# Patient Record
Sex: Male | Born: 2000 | Race: White | Hispanic: Yes | Marital: Single | Smoking: Never smoker
Health system: Southern US, Community
[De-identification: ages and names within clinical notes are randomized; demographics above are authoritative.]

## PROBLEM LIST (undated history)

## (undated) DIAGNOSIS — Z8669 Personal history of other diseases of the nervous system and sense organs: Secondary | ICD-10-CM

## (undated) HISTORY — PX: NO PAST SURGERIES: SHX2092

## (undated) HISTORY — DX: Personal history of other diseases of the nervous system and sense organs: Z86.69

---
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
## 2019-02-02 ENCOUNTER — Encounter (HOSPITAL_COMMUNITY): Payer: Self-pay

## 2019-02-02 ENCOUNTER — Emergency Department (HOSPITAL_COMMUNITY): Payer: Medicaid Other

## 2019-02-02 ENCOUNTER — Emergency Department (HOSPITAL_COMMUNITY)
Admission: EM | Admit: 2019-02-02 | Discharge: 2019-02-03 | Disposition: A | Discharge status: Home / Self Care | Payer: Medicaid Other | Attending: Emergency Medicine | Admitting: Emergency Medicine

## 2019-02-02 DIAGNOSIS — Y9389 Activity, other specified: Secondary | ICD-10-CM | POA: Diagnosis not present

## 2019-02-02 DIAGNOSIS — S7001XA Contusion of right hip, initial encounter: Secondary | ICD-10-CM | POA: Insufficient documentation

## 2019-02-02 DIAGNOSIS — Y929 Unspecified place or not applicable: Secondary | ICD-10-CM | POA: Insufficient documentation

## 2019-02-02 DIAGNOSIS — S299XXA Unspecified injury of thorax, initial encounter: Secondary | ICD-10-CM | POA: Diagnosis present

## 2019-02-02 DIAGNOSIS — S2231XA Fracture of one rib, right side, initial encounter for closed fracture: Secondary | ICD-10-CM | POA: Insufficient documentation

## 2019-02-02 DIAGNOSIS — S62002A Unspecified fracture of navicular [scaphoid] bone of left wrist, initial encounter for closed fracture: Secondary | ICD-10-CM | POA: Insufficient documentation

## 2019-02-02 DIAGNOSIS — M791 Myalgia, unspecified site: Secondary | ICD-10-CM

## 2019-02-02 DIAGNOSIS — Y998 Other external cause status: Secondary | ICD-10-CM | POA: Diagnosis not present

## 2019-02-02 MED ORDER — KETOROLAC TROMETHAMINE 60 MG/2ML IM SOLN
60.0000 mg | Freq: Once | INTRAMUSCULAR | Status: AC
  Administered 2019-02-03: 60 mg via INTRAMUSCULAR
  Filled 2019-02-02: qty 2

## 2019-02-02 MED ORDER — IBUPROFEN 800 MG PO TABS
800.0000 mg | ORAL_TABLET | Freq: Once | ORAL | Status: AC
  Administered 2019-02-03: 01:00:00 800 mg via ORAL
  Filled 2019-02-02: qty 1

## 2019-02-02 MED ORDER — OXYCODONE-ACETAMINOPHEN 5-325 MG PO TABS
1.0000 | ORAL_TABLET | Freq: Once | ORAL | Status: AC
  Administered 2019-02-03: 1 via ORAL
  Filled 2019-02-02: qty 1

## 2019-02-02 MED ORDER — OXYCODONE-ACETAMINOPHEN 5-325 MG PO TABS
1.0000 | ORAL_TABLET | ORAL | Status: DC | PRN
  Administered 2019-02-02: 1 via ORAL
  Filled 2019-02-02: qty 1

## 2019-02-02 NOTE — ED Triage Notes (Signed)
Pt states that he was riding a horse and the saddle came off, pt fell off, did not hit head, no LOC, feeling some dizziness, some SOB, c/o of pain to L wrist, abrasion to R shoulder and R flank and R hip pain

## 2019-02-02 NOTE — ED Notes (Signed)
Please call mother Denton Brick or Girl York Pellant at [phone] or [phone]

## 2019-02-02 NOTE — ED Notes (Signed)
Pt requesting pain medication.  

## 2019-02-02 NOTE — ED Provider Notes (Signed)
[facility] Provider Note   CSN: [id number] Arrival date & time: 02/02/19  2030    History   Chief Complaint Chief Complaint  Patient presents with  . Fall    HPI Zachary Lambert is a 18 y.o. male.     18 year old right hand dominant male presents to the emergency department for evaluation after falling off of a horse.  He states that he was riding a horse when the saddle came loose and he fell off on his right side.  He was not wearing a helmet, but did not hit his head and did not lose consciousness.  Initially felt dizziness with some shortness of breath, worse with deep breathing.  Has persistently been having pain to his left wrist, right hip.  Pain to wrist and hip are worse with movement and ambulation, respectively.  He had no improvement in pain with Percocet given in triage.  No abdominal pain, bowel or bladder incontinence, extremity numbness or paresthesias, extremity weakness, nausea or vomiting.  The history is provided by the patient. No language interpreter was used.  Fall    Past Medical History:  Diagnosis Date  . History of migraine headaches    D/Cd Sumatriptan in 2014 for lack of need    Patient Active Problem List   Diagnosis Date Noted  . 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 Ankle pain, left 03/11/2012  . 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 NO PAST SURGERIES          Home Medications    Prior to Admission medications   Medication Sig Start Date End Date Taking? Authorizing Provider  fluticasone (FLONASE) 50 MCG/ACT nasal spray Sniff one spray into each nostril once a day for allergy symptom control.  Gargle and spit after use. 12/04/16   Lurlean Leyden, MD  HYDROcodone-acetaminophen (NORCO/VICODIN) 5-325 MG tablet Take 1 tablet by mouth every 6 (six) hours as  needed for severe pain. 02/03/19   Antonietta Breach, PA-C  ibuprofen (ADVIL) 800 MG tablet Take one tablet every 8 to 12 hours as needed for pain management 02/03/19   Antonietta Breach, PA-C  loratadine (CLARITIN) 10 MG tablet Take one tablet by mouth once daily for allergy symptom control 10/26/16   Lurlean Leyden, MD    Family History Family History  Problem Relation Age of Onset  . Diabetes Mother   . Hypertension Mother   . Obesity Mother   . Migraines Mother   . Hyperlipidemia Father   . Hypertension Maternal Grandmother   . Diabetes Maternal Grandfather   . Cancer Paternal Grandfather        Colon CA -died age 29yrs  . Asthma Brother   . Cancer Paternal Uncle   . Seizures Neg Hx   . Depression Neg Hx   . Anxiety disorder Neg Hx   . Bipolar disorder Neg Hx   . Schizophrenia Neg Hx   . ADD / ADHD Neg Hx   . Autism Neg Hx     Social History Social History   Tobacco Use  . Smoking status: Never Smoker  . Smokeless tobacco: Never Used  Substance Use Topics  . Alcohol use: Not on file  . Drug use: Not on file     Allergies   Patient has no known allergies.   Review of Systems Review of Systems Ten systems  reviewed and are negative for acute change, except as noted in the HPI.    Physical Exam Updated Vital Signs BP 139/74   Pulse 76   Temp 98.5 F (36.9 C) (Oral)   Resp 19   SpO2 100%   Physical Exam Vitals signs and nursing note reviewed.  Constitutional:      General: He is not in acute distress.    Appearance: He is well-developed. He is not diaphoretic.     Comments: Nontoxic appearing and in NAD  HENT:     Head: Normocephalic and atraumatic.     Comments: No battle's sign or raccoon's eyes.    Right Ear: External ear normal.     Left Ear: External ear normal.  Eyes:     General: No scleral icterus.    Conjunctiva/sclera: Conjunctivae normal.  Neck:     Musculoskeletal: Normal range of motion.     Comments: Normal ROM Cardiovascular:     Rate and  Rhythm: Normal rate and regular rhythm.     Pulses: Normal pulses.  Pulmonary:     Effort: Pulmonary effort is normal. No respiratory distress.     Breath sounds: No stridor. No wheezing, rhonchi or rales.     Comments: Respirations even and unlabored. TTP to right posterior chest wall without crepitus or deformity. Lungs CTAB. Abdominal:     Palpations: Abdomen is soft.     Tenderness: There is no abdominal tenderness.     Comments: Soft, nontender, nondistended.  Musculoskeletal: Normal range of motion.     Comments: Normal AROM and PROM of the right hip. No leg shortening or malrotation. TTP to the right hip joint. No crepitus or deformity.  TTP to the radial aspect of the left wrist at the base of the thumb. +Snuffbox tenderness. No crepitus, deformity, or significant swelling.  Skin:    General: Skin is warm and dry.     Coloration: Skin is not pale.     Findings: Abrasion present. No erythema or rash.       Neurological:     General: No focal deficit present.     Mental Status: He is alert and oriented to person, place, and time.     Coordination: Coordination normal.  Psychiatric:        Behavior: Behavior normal.      ED Treatments / Results  Labs (all labs ordered are listed, but only abnormal results are displayed) Labs Reviewed - No data to display  EKG None  Radiology Dg Ribs Unilateral W/chest Right  Result Date: 02/03/2019 CLINICAL DATA:  Fall off horse with right chest pain. Pleuritic pain. EXAM: RIGHT RIBS AND CHEST - 3+ VIEW COMPARISON:  None. FINDINGS: Suspected nondisplaced fracture of the anterolateral eleventh rib. Remaining ribs are intact. No pneumothorax, focal airspace disease, or pleural fluid. Normal heart size and mediastinal contours. IMPRESSION: Suspected nondisplaced right anterolateral eleventh rib fracture. No pulmonary complication such as pneumothorax. Electronically Signed   By: Narda RutherfordMelanie  Sanford M.D.   On: 02/03/2019 00:25   Dg Wrist  Complete Left  Result Date: 02/02/2019 CLINICAL DATA:  Pt. Larey SeatFell off of a horse today and landed on left wrist extended. Pt. Also c/o right hip pain. Painful with weight bearing. EXAM: LEFT WRIST - COMPLETE 3+ VIEW COMPARISON:  None. FINDINGS: There is a subtle nondisplaced fracture along the volar radial margin of the distal scaphoid. No other evidence of a fracture. The wrist joints are normally spaced and aligned. Soft tissues are unremarkable. IMPRESSION: 1.  Subtle, nondisplaced fracture along the radial distal margin of the scaphoid. 2. No other fracture.  No dislocation. Electronically Signed   By: Amie Portlandavid  Ormond M.D.   On: 02/02/2019 21:26   Ct Hip Right Wo Contrast  Result Date: 02/03/2019 CLINICAL DATA:  Right hip pain after fall off horse. EXAM: CT OF THE RIGHT HIP WITHOUT CONTRAST TECHNIQUE: Multidetector CT imaging of the right hip was performed according to the standard protocol. Multiplanar CT image reconstructions were also generated. COMPARISON:  Radiograph yesterday FINDINGS: Bones/Joint/Cartilage No acute fracture. The femoral head is located. Hip joint spaces preserved. Incidental bone island in the right acetabulum. Pubic symphyseal growth plates have not yet fused, normal for age. Ligaments Suboptimally assessed by CT. Muscles and Tendons No confluent muscle hematoma. Soft tissues Mild posterior subcutaneous edema about the upper gluteal region. IMPRESSION: 1. No fracture or acute osseous abnormality of the right hip. 2. Subcutaneous soft tissue edema posteriorly. Electronically Signed   By: Narda RutherfordMelanie  Sanford M.D.   On: 02/03/2019 00:11   Dg Hip Unilat  With Pelvis 2-3 Views Right  Result Date: 02/02/2019 CLINICAL DATA:  Larey SeatFell off a horse today, RIGHT hip pain EXAM: DG HIP (WITH OR WITHOUT PELVIS) 2-3V RIGHT COMPARISON:  None FINDINGS: Osseous mineralization normal. Joint spaces preserved. No fracture, dislocation, or bone destruction. IMPRESSION: Normal exam. Electronically Signed   By:  Ulyses SouthwardMark  Boles M.D.   On: 02/02/2019 21:25    Procedures Procedures (including critical care time)  Medications Ordered in ED Medications  ibuprofen (ADVIL) tablet 800 mg (800 mg Oral Given 02/03/19 0033)  oxyCODONE-acetaminophen (PERCOCET/ROXICET) 5-325 MG per tablet 1 tablet (1 tablet Oral Given 02/03/19 0033)  ketorolac (TORADOL) injection 60 mg (60 mg Intramuscular Given 02/03/19 0033)     Initial Impression / Assessment and Plan / ED Course  I have reviewed the triage vital signs and the nursing notes.  Pertinent labs & imaging results that were available during my care of the patient were reviewed by me and considered in my medical decision making (see chart for details).        18 year old male presenting following a fall from a horse.  He had no head trauma or loss of consciousness.  Noting left wrist pain and right hip pain primarily.  Also complaining of some right-sided pleuritic pain.  He underwent imaging of the wrist, hip, ribs.  Work-up reveals left scaphoid fracture and right 11th rib fracture.  No pneumothorax or evidence of hip fracture.  Neurovascularly intact.  No red flags or signs concerning for cauda equina. VSS.  Wrist stabilized with thumb spica splint.  Pain was treated with Toradol and Percocet in the ED.  He has been instructed to follow-up with orthopedic hand surgery to ensure proper healing of his scaphoid fracture.  Advised continued use of NSAIDs with PRN use of Norco for pain control.  Return precautions discussed and provided. Patient discharged in stable condition with no unaddressed concerns.   Final Clinical Impressions(s) / ED Diagnoses   Final diagnoses:  Fall from horse, initial encounter  Closed fracture of one rib of right side, initial encounter  Closed nondisplaced fracture of scaphoid of left wrist, unspecified portion of scaphoid, initial encounter  Contusion of right hip, initial encounter    ED Discharge Orders         Ordered     ibuprofen (ADVIL) 800 MG tablet     02/03/19 0134    HYDROcodone-acetaminophen (NORCO/VICODIN) 5-325 MG tablet  Every 6 hours PRN  02/03/19 0134           Antony MaduraHumes, [place] Pechacek, PA-C 02/03/19 [id number]    Geoffery Lyonselo, [place], MD 02/03/19 [id number]

## 2019-02-03 ENCOUNTER — Emergency Department (HOSPITAL_COMMUNITY): Payer: Medicaid Other

## 2019-02-03 MED ORDER — IBUPROFEN 800 MG PO TABS: ORAL_TABLET | ORAL | 0 refills | Status: AC

## 2019-02-03 MED ORDER — HYDROCODONE-ACETAMINOPHEN 5-325 MG PO TABS: 1.0000 | ORAL_TABLET | Freq: Four times a day (QID) | ORAL | 0 refills | Status: AC | PRN

## 2019-02-03 NOTE — Discharge Instructions (Signed)
Keep your wrist splint on at all times.  Do not remove and was told to do so by an orthopedic hand surgeon.  Call the office of Dr. Fredna Dow in the morning to schedule an office visit for follow-up in the next 1 to 2 weeks.  Ice areas of pain to limit inflammation.  Take ibuprofen as prescribed for pain control.  If pain is severe, use Norco as prescribed.  Do not drive or drink alcohol after taking this medication as it may make you drowsy and impair your judgment.  You may see your primary care doctor for repeat evaluation in 1 week.  Return to the ED for new or concerning symptoms.

## 2019-02-03 NOTE — ED Notes (Signed)
Ortho tech placed spica splint on L wrist. ED staff provided crutches with education.

## 2020-05-09 IMAGING — DX RIGHT RIBS AND CHEST - 3+ VIEW
4 series · 4 of 4 positions shown · non-contrast
Comparison: None.

CLINICAL DATA: Fall off horse with right chest pain. Pleuritic
pain.

EXAM:
RIGHT RIBS AND CHEST - 3+ VIEW

[chest pa]
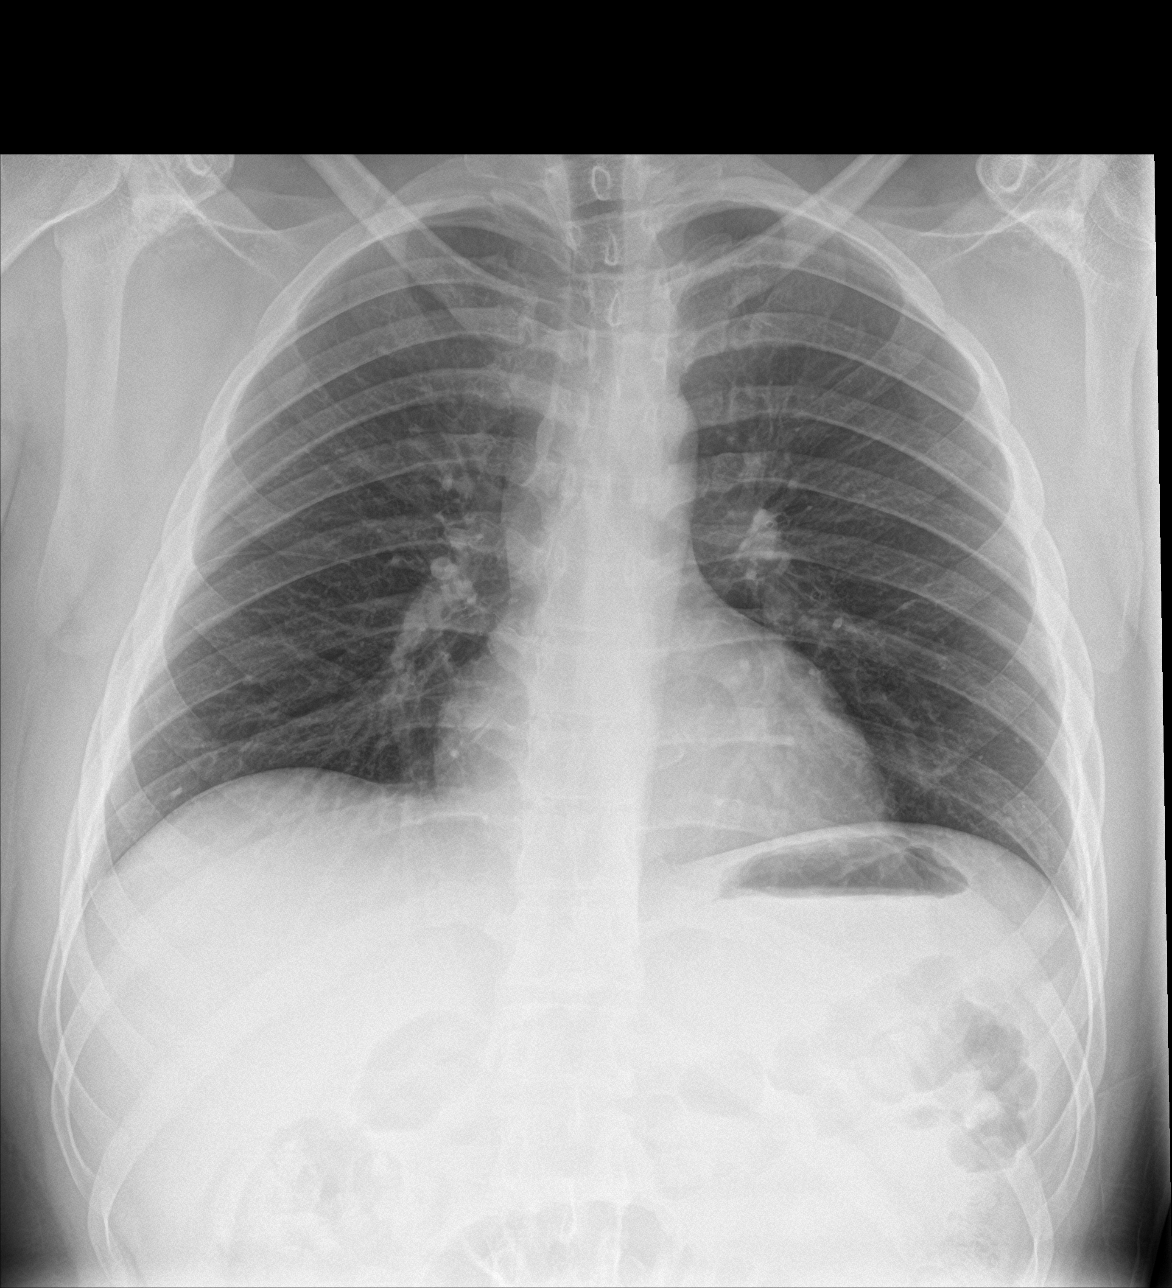

[rib pa]
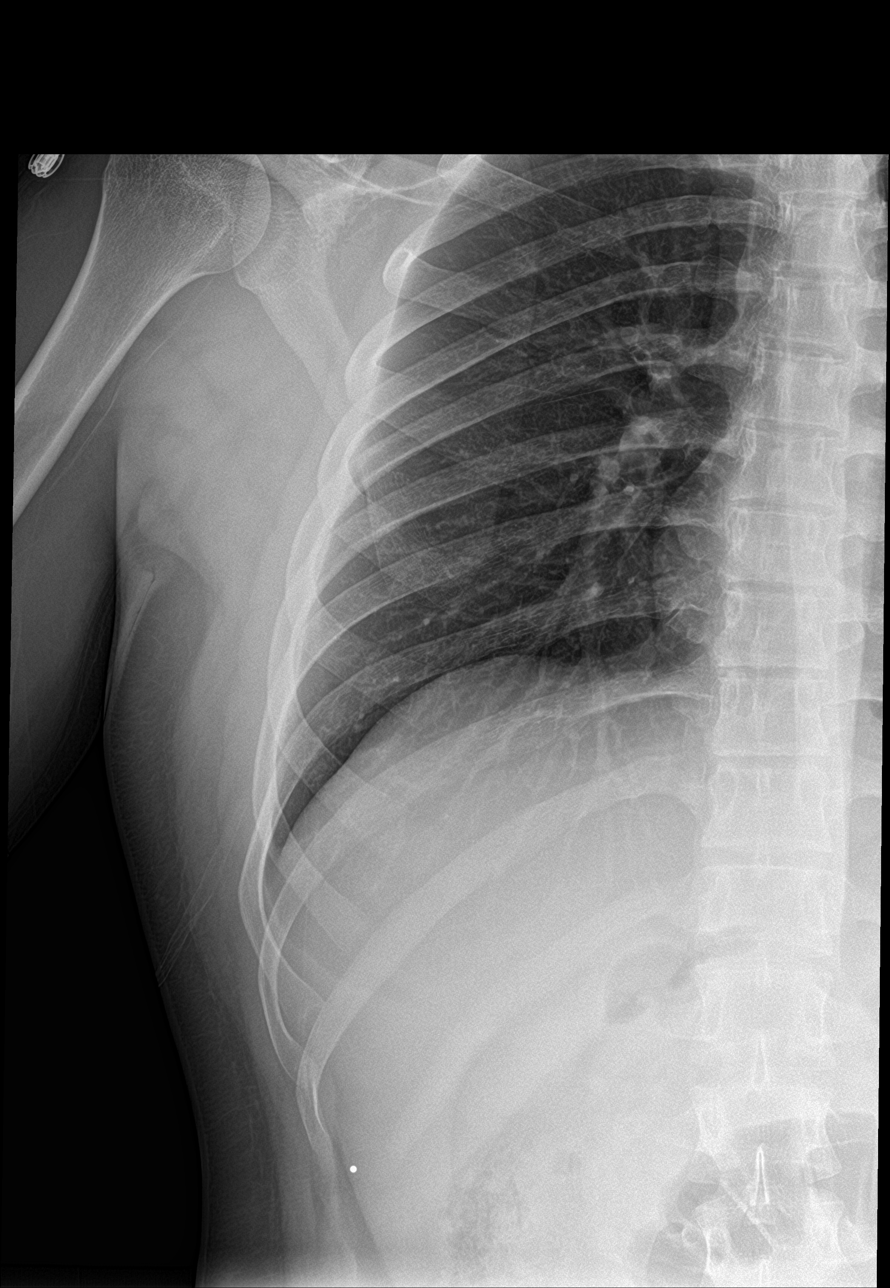

[rib pa obl]
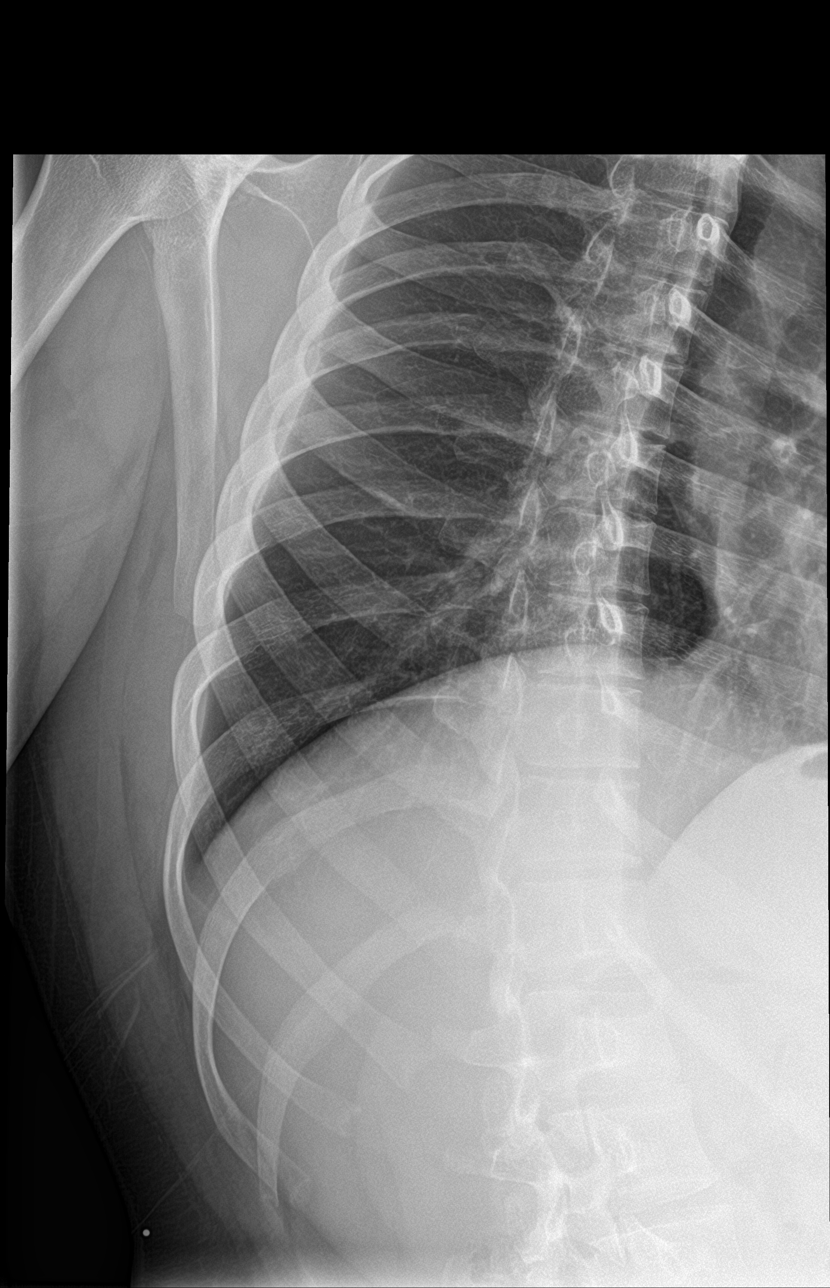

[rib ap obl]
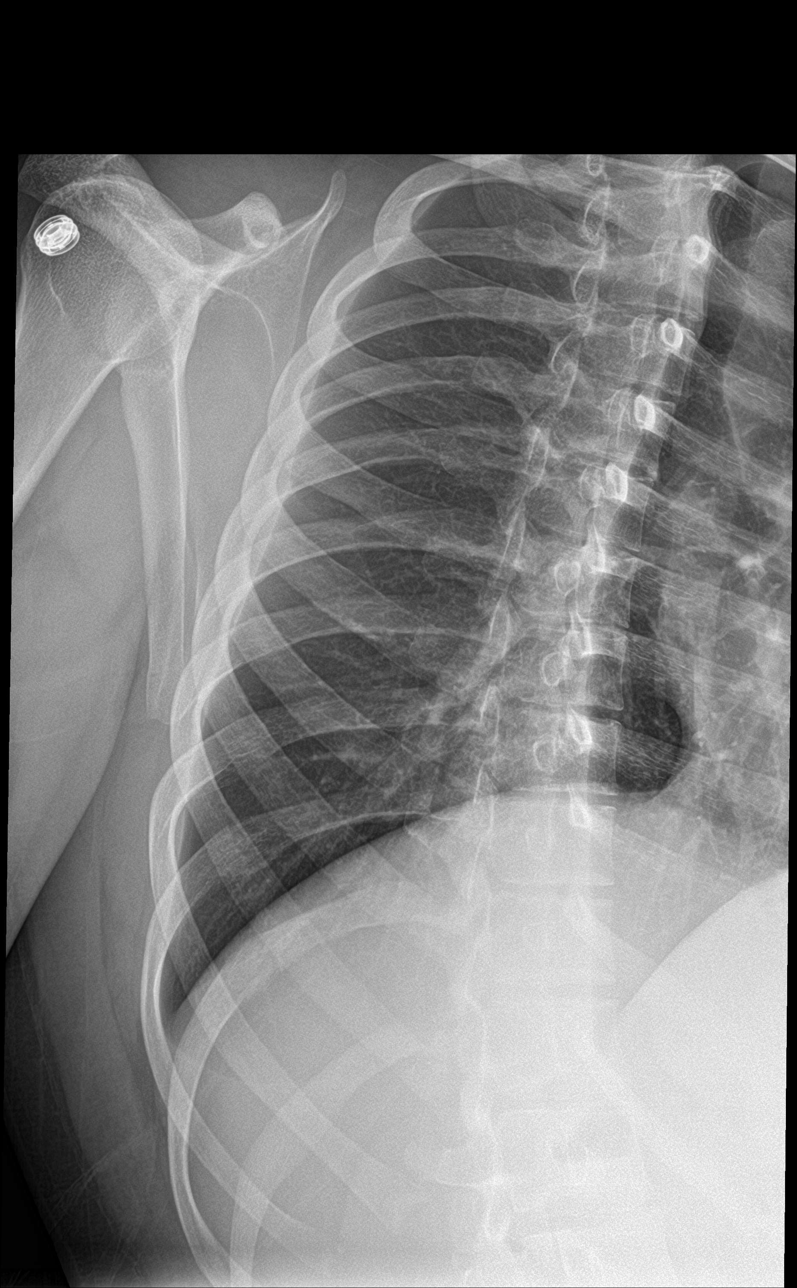

[4 of 4 positions shown; findings below may reference images not displayed]

FINDINGS: Suspected nondisplaced fracture of the anterolateral eleventh rib.
Remaining ribs are intact. No pneumothorax, focal airspace disease,
or pleural fluid. Normal heart size and mediastinal contours.
IMPRESSION: Suspected nondisplaced right anterolateral eleventh rib fracture. No
pulmonary complication such as pneumothorax.

## 2020-05-09 IMAGING — CT CT OF THE RIGHT HIP WITHOUT CONTRAST
2 of 3 series · 16 of 46 positions shown, 18 images · non-contrast
Comparison: Radiograph yesterday

CLINICAL DATA: Right hip pain after fall off horse.

EXAM:
CT OF THE RIGHT HIP WITHOUT CONTRAST
TECHNIQUE: Multidetector CT imaging of the right hip was performed according to
the standard protocol. Multiplanar CT image reconstructions were
also generated.

[Series 5: hip 2.0 st · axial · 0.55mm/px · z∈[+570,+840]mm · 13 of 155 slices shown, 15 images]
[im 10/155  soft-tissue]
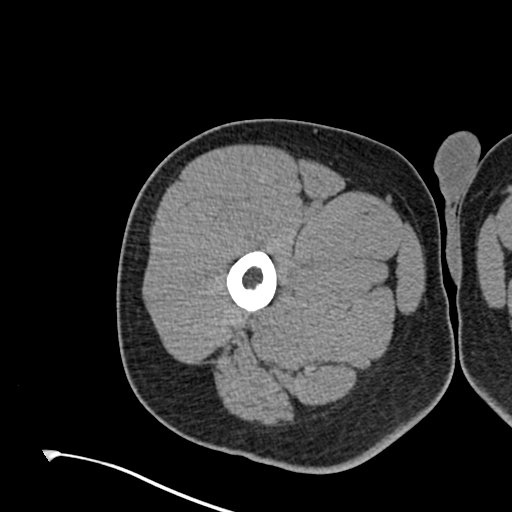
[im 10/155  bone]
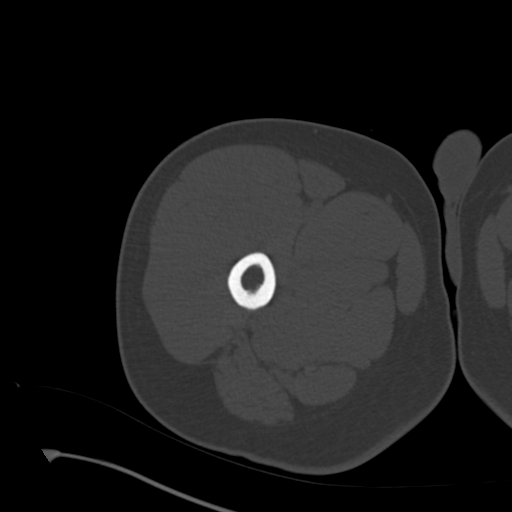
[im 20/155  soft-tissue]
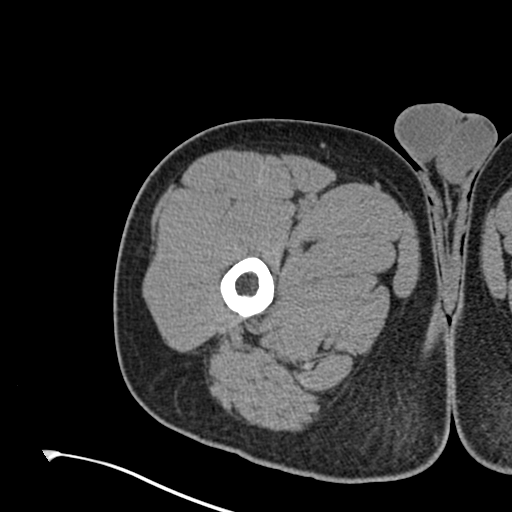
[im 30/155  soft-tissue]
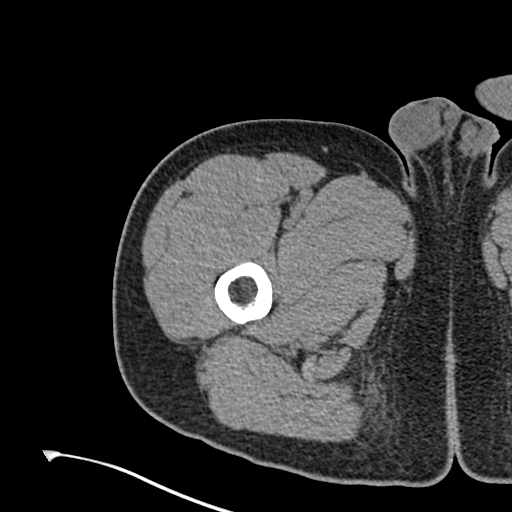
[im 45/155  soft-tissue]
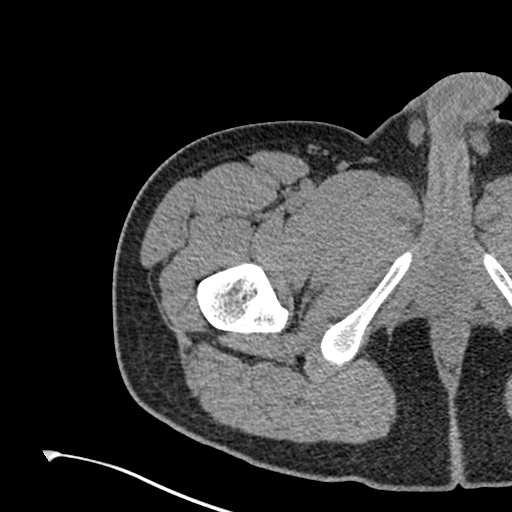
[im 55/155  soft-tissue]
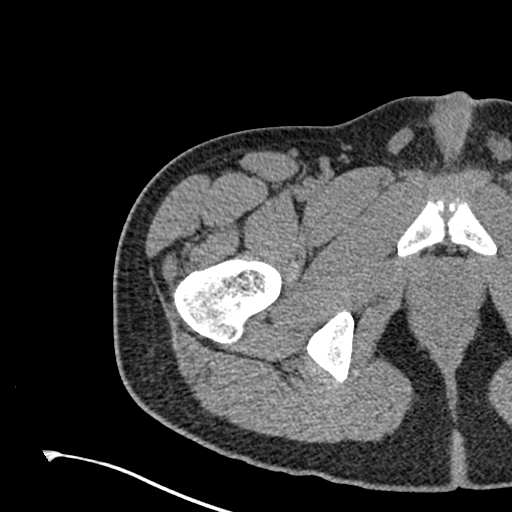
[im 65/155  soft-tissue]
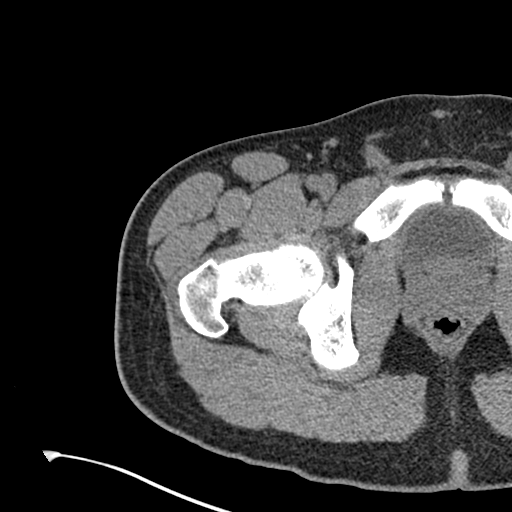
[im 80/155  soft-tissue]
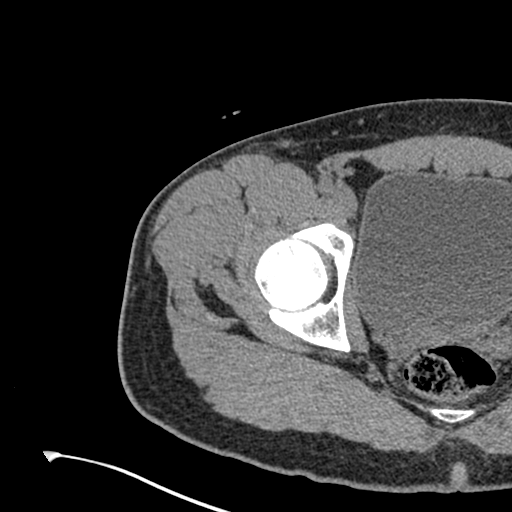
[im 90/155  soft-tissue]
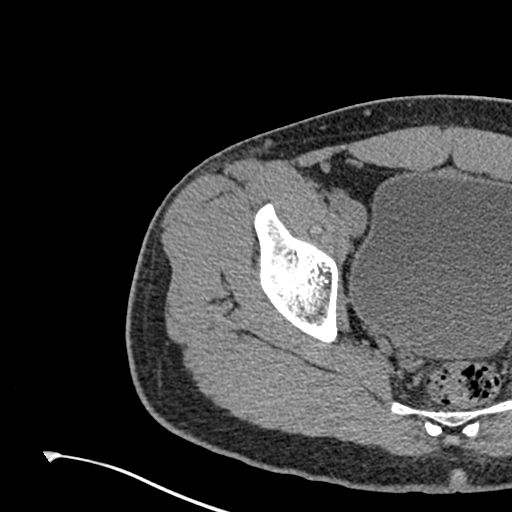
[im 100/155  soft-tissue]
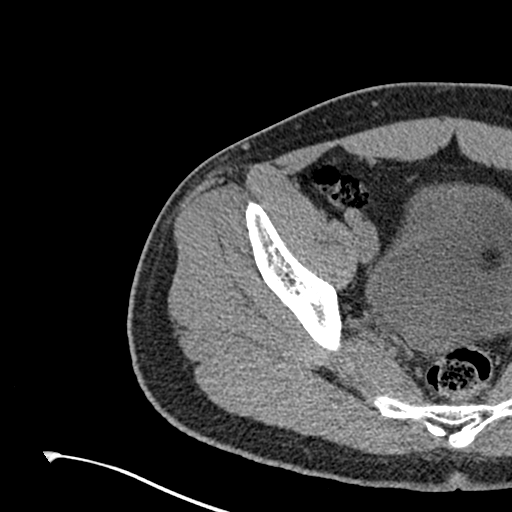
[im 100/155  bone]
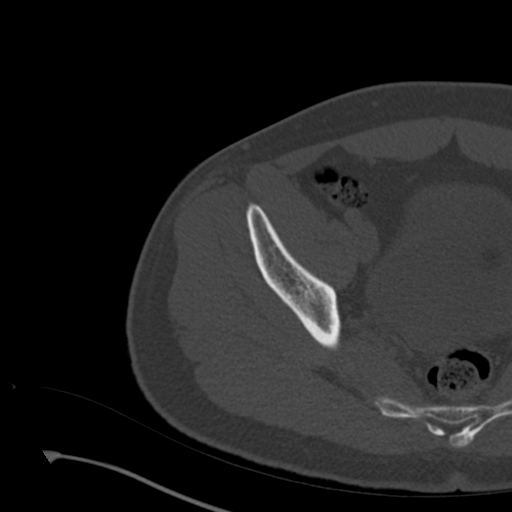
[im 110/155  soft-tissue]
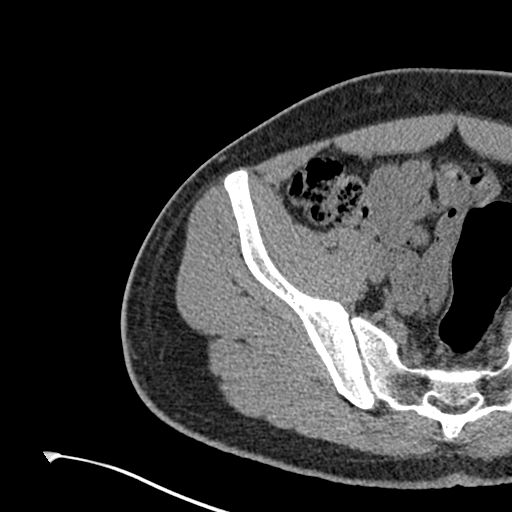
[im 125/155  soft-tissue]
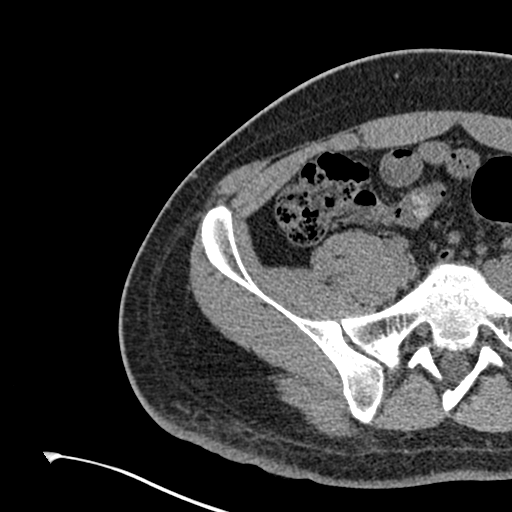
[im 135/155  soft-tissue]
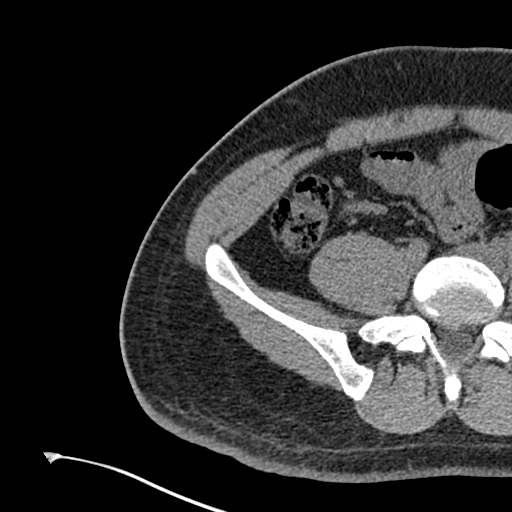
[im 145/155  soft-tissue]
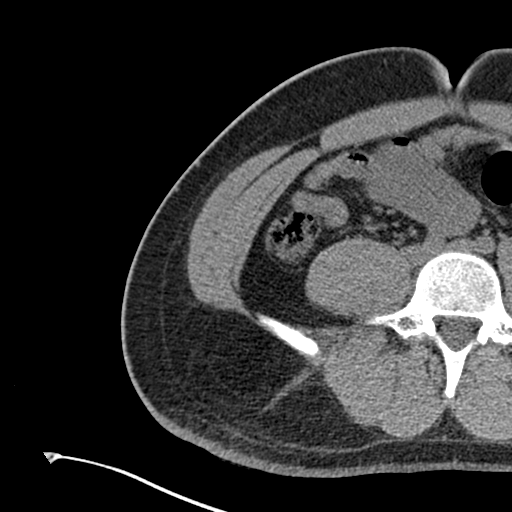

[Series 9: hip 2.0 cor. st · coronal · 0.43mm/px · 3 of 112 slices shown]
[im 38/112  soft-tissue]
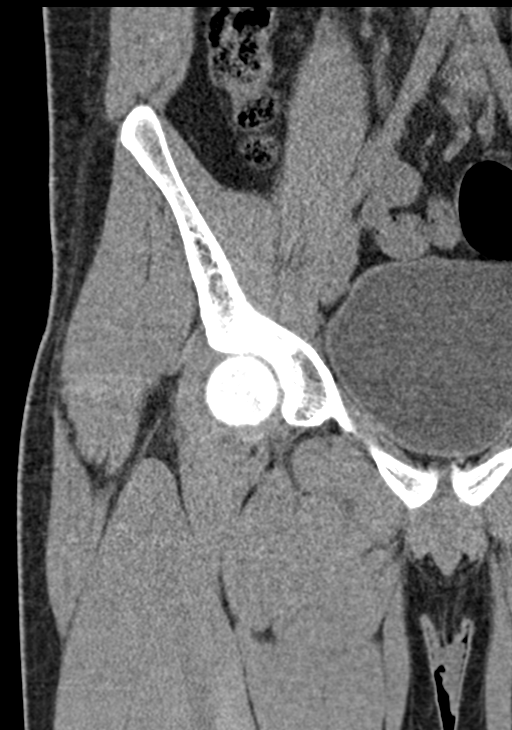
[im 50/112  soft-tissue]
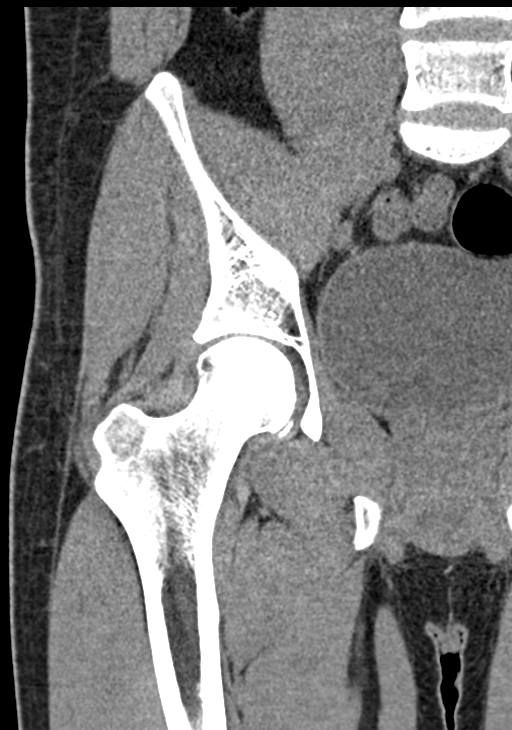
[im 62/112  soft-tissue]
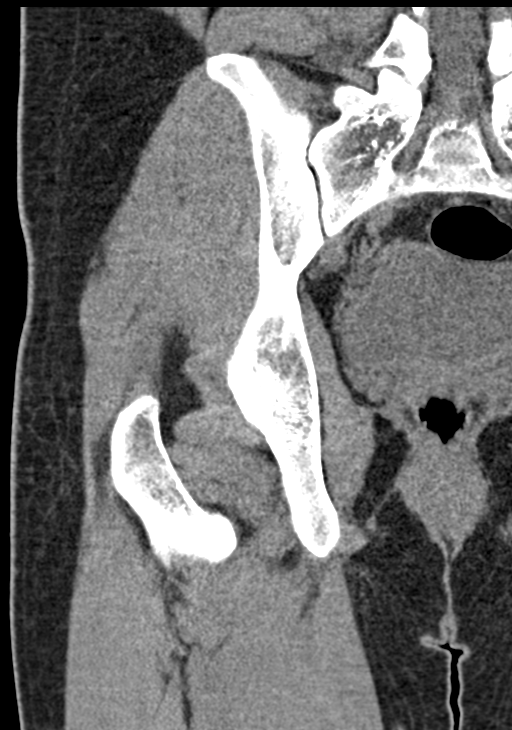

[16 of 46 positions shown; findings below may reference images not displayed]

FINDINGS: Bones/Joint/Cartilage

No acute fracture. The femoral head is located. Hip joint spaces
preserved. Incidental bone island in the right acetabulum. Pubic
symphyseal growth plates have not yet fused, normal for age.

Ligaments

Suboptimally assessed by CT.

Muscles and Tendons

No confluent muscle hematoma.

Soft tissues

Mild posterior subcutaneous edema about the upper gluteal region.
IMPRESSION: 1. No fracture or acute osseous abnormality of the right hip.
2. Subcutaneous soft tissue edema posteriorly.
